# Patient Record
Sex: Male | Born: 1965 | Race: White | Hispanic: No | Marital: Single | Smoking: Never smoker
Health system: Southern US, Community
[De-identification: ages and names within clinical notes are randomized; demographics above are authoritative.]

## PROBLEM LIST (undated history)

## (undated) DIAGNOSIS — K219 Gastro-esophageal reflux disease without esophagitis: Secondary | ICD-10-CM

---

## 2011-06-26 ENCOUNTER — Other Ambulatory Visit: Payer: Self-pay | Admitting: Family Medicine

## 2011-06-26 DIAGNOSIS — N63 Unspecified lump in unspecified breast: Secondary | ICD-10-CM

## 2011-06-27 ENCOUNTER — Other Ambulatory Visit: Payer: Self-pay

## 2011-07-04 ENCOUNTER — Ambulatory Visit
Admission: RE | Admit: 2011-07-04 | Discharge: 2011-07-04 | Disposition: A | Discharge status: Home / Self Care | Payer: 59 | Source: Ambulatory Visit | Attending: Family Medicine | Admitting: Family Medicine

## 2011-07-04 DIAGNOSIS — N63 Unspecified lump in unspecified breast: Secondary | ICD-10-CM

## 2017-10-25 DIAGNOSIS — Z0000 Encounter for general adult medical examination without abnormal findings: Secondary | ICD-10-CM | POA: Diagnosis not present

## 2017-10-25 DIAGNOSIS — Z13220 Encounter for screening for lipoid disorders: Secondary | ICD-10-CM | POA: Diagnosis not present

## 2017-10-25 DIAGNOSIS — Z23 Encounter for immunization: Secondary | ICD-10-CM | POA: Diagnosis not present

## 2017-11-05 ENCOUNTER — Other Ambulatory Visit: Payer: Self-pay | Admitting: Family Medicine

## 2017-11-05 ENCOUNTER — Ambulatory Visit
Admission: RE | Admit: 2017-11-05 | Discharge: 2017-11-05 | Disposition: A | Discharge status: Home / Self Care | Payer: 59 | Source: Ambulatory Visit | Attending: Family Medicine | Admitting: Family Medicine

## 2017-11-05 DIAGNOSIS — R1310 Dysphagia, unspecified: Secondary | ICD-10-CM

## 2018-01-01 DIAGNOSIS — K222 Esophageal obstruction: Secondary | ICD-10-CM | POA: Diagnosis not present

## 2018-01-01 DIAGNOSIS — Z1211 Encounter for screening for malignant neoplasm of colon: Secondary | ICD-10-CM | POA: Diagnosis not present

## 2018-01-01 DIAGNOSIS — R1310 Dysphagia, unspecified: Secondary | ICD-10-CM | POA: Diagnosis not present

## 2018-02-08 DIAGNOSIS — Z1211 Encounter for screening for malignant neoplasm of colon: Secondary | ICD-10-CM | POA: Diagnosis not present

## 2018-02-08 DIAGNOSIS — K2930 Chronic superficial gastritis without bleeding: Secondary | ICD-10-CM | POA: Diagnosis not present

## 2018-02-08 DIAGNOSIS — K317 Polyp of stomach and duodenum: Secondary | ICD-10-CM | POA: Diagnosis not present

## 2018-02-08 DIAGNOSIS — R1310 Dysphagia, unspecified: Secondary | ICD-10-CM | POA: Diagnosis not present

## 2018-02-08 DIAGNOSIS — D126 Benign neoplasm of colon, unspecified: Secondary | ICD-10-CM | POA: Diagnosis not present

## 2018-02-08 DIAGNOSIS — K200 Eosinophilic esophagitis: Secondary | ICD-10-CM | POA: Diagnosis not present

## 2018-08-01 DIAGNOSIS — H35363 Drusen (degenerative) of macula, bilateral: Secondary | ICD-10-CM | POA: Diagnosis not present

## 2020-02-06 DIAGNOSIS — F439 Reaction to severe stress, unspecified: Secondary | ICD-10-CM | POA: Diagnosis not present

## 2020-02-06 DIAGNOSIS — R55 Syncope and collapse: Secondary | ICD-10-CM | POA: Diagnosis not present

## 2020-02-06 DIAGNOSIS — R202 Paresthesia of skin: Secondary | ICD-10-CM | POA: Diagnosis not present

## 2020-02-06 DIAGNOSIS — H538 Other visual disturbances: Secondary | ICD-10-CM | POA: Diagnosis not present

## 2020-02-24 ENCOUNTER — Encounter (HOSPITAL_COMMUNITY): Payer: Self-pay

## 2020-02-24 ENCOUNTER — Emergency Department (HOSPITAL_COMMUNITY): Payer: BC Managed Care – PPO

## 2020-02-24 ENCOUNTER — Other Ambulatory Visit: Payer: Self-pay

## 2020-02-24 ENCOUNTER — Emergency Department (HOSPITAL_COMMUNITY)
Admission: EM | Admit: 2020-02-24 | Discharge: 2020-02-24 | Disposition: A | Discharge status: Home / Self Care | Payer: BC Managed Care – PPO | Attending: Emergency Medicine | Admitting: Emergency Medicine

## 2020-02-24 DIAGNOSIS — H538 Other visual disturbances: Secondary | ICD-10-CM | POA: Diagnosis not present

## 2020-02-24 DIAGNOSIS — R202 Paresthesia of skin: Secondary | ICD-10-CM | POA: Diagnosis not present

## 2020-02-24 DIAGNOSIS — R519 Headache, unspecified: Secondary | ICD-10-CM | POA: Diagnosis not present

## 2020-02-24 DIAGNOSIS — I10 Essential (primary) hypertension: Secondary | ICD-10-CM | POA: Diagnosis not present

## 2020-02-24 HISTORY — DX: Gastro-esophageal reflux disease without esophagitis: K21.9

## 2020-02-24 LAB — BASIC METABOLIC PANEL
Anion gap: 8 (ref 5–15)
BUN: 20 mg/dL (ref 6–20)
CO2: 27 mmol/L (ref 22–32)
Calcium: 9.6 mg/dL (ref 8.9–10.3)
Chloride: 102 mmol/L (ref 98–111)
Creatinine, Ser: 1.22 mg/dL (ref 0.61–1.24)
GFR calc Af Amer: >60 mL/min (ref >60)
GFR calc non Af Amer: >60 mL/min (ref >60)
Glucose, Bld: 100 mg/dL — ABNORMAL HIGH (ref 70–99)
Potassium: 4.8 mmol/L (ref 3.5–5.1)
Sodium: 137 mmol/L (ref 135–145)

## 2020-02-24 LAB — CBC
HCT: 53.5 % — ABNORMAL HIGH (ref 39.0–52.0)
Hemoglobin: 18.4 g/dL — ABNORMAL HIGH (ref 13.0–17.0)
MCH: 30.2 pg (ref 26.0–34.0)
MCHC: 34.4 g/dL (ref 30.0–36.0)
MCV: 87.8 fL (ref 80.0–100.0)
Platelets: 207 10*3/uL (ref 150–400)
RBC: 6.09 MIL/uL — ABNORMAL HIGH (ref 4.22–5.81)
RDW: 12.1 % (ref 11.5–15.5)
WBC: 5.8 10*3/uL (ref 4.0–10.5)
nRBC: 0 % (ref 0.0–0.2)

## 2020-02-24 LAB — MAGNESIUM: Magnesium: 2.1 mg/dL (ref 1.7–2.4)

## 2020-02-24 LAB — TSH: TSH: 1.377 u[IU]/mL (ref 0.350–4.500)

## 2020-02-24 LAB — HEPATIC FUNCTION PANEL
ALT: 38 U/L (ref 0–44)
AST: 30 U/L (ref 15–41)
Albumin: 4.5 g/dL (ref 3.5–5.0)
Alkaline Phosphatase: 49 U/L (ref 38–126)
Bilirubin, Direct: 0.3 mg/dL — ABNORMAL HIGH (ref 0.0–0.2)
Indirect Bilirubin: 3.2 mg/dL — ABNORMAL HIGH (ref 0.3–0.9)
Total Bilirubin: 3.5 mg/dL — ABNORMAL HIGH (ref 0.3–1.2)
Total Protein: 7.5 g/dL (ref 6.5–8.1)

## 2020-02-24 LAB — SEDIMENTATION RATE: Sed Rate: 1 mm/hr (ref 0–16)

## 2020-02-24 MED ORDER — HYDROXYZINE HCL 25 MG PO TABS: 25.0000 mg | ORAL_TABLET | Freq: Four times a day (QID) | ORAL | 0 refills | Status: AC | PRN

## 2020-02-24 MED ORDER — KETOROLAC TROMETHAMINE 30 MG/ML IJ SOLN: 30.0000 mg | Freq: Once | INTRAMUSCULAR | Status: DC

## 2020-02-24 MED ORDER — LORAZEPAM 2 MG/ML IJ SOLN
INTRAMUSCULAR | Status: AC
  Administered 2020-02-24: 1 mg via INTRAVENOUS
  Filled 2020-02-24: qty 1

## 2020-02-24 MED ORDER — AMLODIPINE BESYLATE 5 MG PO TABS
5.0000 mg | ORAL_TABLET | Freq: Once | ORAL | Status: AC
  Administered 2020-02-24: 5 mg via ORAL
  Filled 2020-02-24: qty 1

## 2020-02-24 MED ORDER — SODIUM CHLORIDE 0.9% FLUSH: 3.0000 mL | Freq: Once | INTRAVENOUS | Status: DC

## 2020-02-24 MED ORDER — DIPHENHYDRAMINE HCL 50 MG/ML IJ SOLN: 25.0000 mg | Freq: Once | INTRAMUSCULAR | Status: DC

## 2020-02-24 MED ORDER — METOCLOPRAMIDE HCL 5 MG/ML IJ SOLN: 10.0000 mg | Freq: Once | INTRAMUSCULAR | Status: DC

## 2020-02-24 MED ORDER — LORAZEPAM 2 MG/ML IJ SOLN: 1.0000 mg | Freq: Once | INTRAMUSCULAR | Status: AC

## 2020-02-24 MED ORDER — AMLODIPINE BESYLATE 5 MG PO TABS
5.0000 mg | ORAL_TABLET | Freq: Every day | ORAL | 0 refills | Status: AC
Start: 2020-02-24 — End: ?

## 2020-02-24 NOTE — ED Provider Notes (Signed)
[facility] DEPT Provider Note   CSN: [id number] Arrival date & time: 02/24/20  0754     History Chief Complaint  Patient presents with  . Numbness  . Blurred Vision  . Nausea    Codee E Von Der Erlene Senters is a 54 y.o. male.  HPI Patient has had several recurrent episodes over the past month of tingling and numbness sensation along the left side of his face and sometimes involving the left arm.  These episodes have lasted anywhere from 15 minutes to an hour.  Sometimes there is associated pain around the ear in conjunction with feeling of numbness or tingling of the lateral face and left arm.  Patient is not experiencing severe or lancinating headaches.  Vision will be blurred in association of these headaches, patient notes that the blurring seems slightly worse in the left as opposed to the right but both eyes are involved.  No problems with gait dysfunction.  No problems with incoordination.  Patient reports that he did see his PCP and had an EKG done that was normal as well as some screening lab work.  He has not had any imaging done.  He does not experience nausea or vomiting in association with these episodes.  No fevers or chills.  Patient has not had sinus congestion sore throat or other signs of infectious symptoms.  No cough or chest congestion.  Patient reports that his blood pressure has been moderately elevated for at least a number of months.  He reports that the diastolic numbers consistently high and sometimes a systolic numbers up in the 160s.  He reports his PCP has not started him on any medications at this point.    Past Medical History:  Diagnosis Date  . GERD (gastroesophageal reflux disease)     There are no problems to display for this patient.   History reviewed. No pertinent surgical history.     Family History  Problem Relation Age of Onset  . Rheum arthritis Mother   . Polymyalgia rheumatica Mother   . Hypertension Mother     . Parkinson's disease Father   . Cancer Father   . Hypertension Father     Social History   Tobacco Use  . Smoking status: Never Smoker  . Smokeless tobacco: Never Used  Substance Use Topics  . Alcohol use: Never  . Drug use: Never    Home Medications Prior to Admission medications   Medication Sig Start Date End Date Taking? Authorizing Provider  ibuprofen (ADVIL) 200 MG tablet Take 400 mg by mouth every 6 (six) hours as needed for headache or mild pain.   Yes [provider]  pantoprazole (PROTONIX) 40 MG tablet Take 40 mg by mouth daily. 01/09/20  Yes [provider]  amLODipine (NORVASC) 5 MG tablet Take 1 tablet (5 mg total) by mouth daily. 02/24/20   Charlesetta Shanks, MD  hydrOXYzine (ATARAX/VISTARIL) 25 MG tablet Take 1 tablet (25 mg total) by mouth every 6 (six) hours as needed for anxiety. 02/24/20   Charlesetta Shanks, MD    Allergies    Prednisone  Review of Systems   Review of Systems 10 systems reviewed and negative except as per HPI. Physical Exam Updated Vital Signs BP (!) 143/103   Pulse (!) 58   Temp 97.8 F (36.6 C) (Oral)   Resp 18   Ht 6' (1.829 m)   SpO2 100%   Physical Exam Constitutional:      Appearance: Normal appearance. He is  well-developed.  HENT:     Head: Normocephalic and atraumatic.     Right Ear: Tympanic membrane normal.     Left Ear: Tympanic membrane normal.     Nose: Nose normal.     Mouth/Throat:     Mouth: Mucous membranes are moist.     Pharynx: Oropharynx is clear.  Eyes:     Extraocular Movements: Extraocular movements intact.     Conjunctiva/sclera: Conjunctivae normal.     Pupils: Pupils are equal, round, and reactive to light.  Cardiovascular:     Rate and Rhythm: Normal rate and regular rhythm.     Heart sounds: Normal heart sounds.  Pulmonary:     Effort: Pulmonary effort is normal.     Breath sounds: Normal breath sounds.  Abdominal:     General: Bowel sounds are normal. There is no distension.      Palpations: Abdomen is soft.     Tenderness: There is no abdominal tenderness.  Musculoskeletal:        General: Normal range of motion.     Cervical back: Neck supple.  Skin:    General: Skin is warm and dry.  Neurological:     Mental Status: He is alert and oriented to person, place, and time.     GCS: GCS eye subscore is 4. GCS verbal subscore is 5. GCS motor subscore is 6.     Coordination: Coordination normal.     Comments: Cognitive function normal.  Speech clear.  No cranial nerve deficits.  Normal finger-nose exam.  Normal consensual pupillary responses.  Normal heel toe walk.  Motor strength 5\5 upper and lower extremities.  No differential to light touch right to left on face upper extremity or lower extremity.  Psychiatric:        Mood and Affect: Mood normal.     ED Results / Procedures / Treatments   Labs (all labs ordered are listed, but only abnormal results are displayed) Labs Reviewed  BASIC METABOLIC PANEL - Abnormal; Notable for the following components:      Result Value   Glucose, Bld 100 (*)    All other components within normal limits  CBC - Abnormal; Notable for the following components:   RBC 6.09 (*)    Hemoglobin 18.4 (*)    HCT 53.5 (*)    All other components within normal limits  HEPATIC FUNCTION PANEL - Abnormal; Notable for the following components:   Total Bilirubin 3.5 (*)    Bilirubin, Direct 0.3 (*)    Indirect Bilirubin 3.2 (*)    All other components within normal limits  TSH  SEDIMENTATION RATE  MAGNESIUM  URINALYSIS, ROUTINE W REFLEX MICROSCOPIC    EKG EKG Interpretation  Date/Time:  Tuesday Feb 24 2020 08:14:48 EDT Ventricular Rate:  71 PR Interval:    QRS Duration: 98 QT Interval:  373 QTC Calculation: 406 R Axis:   43 Text Interpretation: Sinus rhythm RSR' in V1 or V2, probably normal variant normal, no old comparison Confirmed by Charlesetta Shanks [phone]) on 02/24/2020 12:52:21 PM   Radiology MR Brain Wo Contrast  (neuro protocol)  Result Date: 02/24/2020 CLINICAL DATA:  Tingling of the left face and arm. Some blurred vision and nausea. EXAM: MRI HEAD WITHOUT CONTRAST TECHNIQUE: Multiplanar, multiecho pulse sequences of the brain and surrounding structures were obtained without intravenous contrast. COMPARISON:  None. FINDINGS: Brain: The brain has a normal appearance without evidence of malformation, atrophy, old or acute small or large vessel infarction, mass lesion, hemorrhage,  hydrocephalus or extra-axial collection. Vascular: Major vessels at the base of the brain show flow. Venous sinuses appear patent. Skull and upper cervical spine: Normal. Sinuses/Orbits: Mucosal thickening and opacification of the right division of the sphenoid sinus. Other paranasal sinuses are clear. Orbits negative. Other: None significant. IMPRESSION: Normal appearance of the brain itself. No evidence of acute or old ischemic change or demyelinating disease. Mucosal inflammatory changes of the right division of the sphenoid sinus. Electronically Signed   By: Nelson Chimes M.D.   On: 02/24/2020 12:10    Procedures Procedures (including critical care time)  Medications Ordered in ED Medications  amLODipine (NORVASC) tablet 5 mg (has no administration in time range)  LORazepam (ATIVAN) injection 1 mg (1 mg Intravenous Given 02/24/20 1127)    ED Course  I have reviewed the triage vital signs and the nursing notes.  Pertinent labs & imaging results that were available during my care of the patient were reviewed by me and considered in my medical decision making (see chart for details).    MDM Rules/Calculators/A&P                       Patient presents aligned above.  He is experiencing more frequent episodes of paresthesias and blurred vision over the past 24 hours.  Patient neurologic exam is normal.  No signs of infectious etiology.  MRI obtained to rule out stroke or MS.  No acute findings identified.  Patient has diastolic  hypertension.  He describes this is consistent now for at least several months.  I do not suspect that blood pressures are responsible for the patient's symptoms.  However with consistent diastolics [id number] over a number of months will start empiric dose of amlodipine 5 mg with recommendation for close follow-up with PCP.  Ambulatory referral made for [facility] for further evaluation of patient's symptoms.  We discussed possibility of migraine variant type symptoms with some degree of pain in association with paresthesias.  Return precautions reviewed. Patient presents aligned above.  He experienced more frequent episodes of final Clinical Impression(s) / ED Diagnoses Final diagnoses:  Paresthesia  Nonintractable episodic headache, unspecified headache type  Blurred vision    Rx / DC Orders ED Discharge Orders         Ordered    amLODipine (NORVASC) 5 MG tablet  Daily     02/24/20 1407    hydrOXYzine (ATARAX/VISTARIL) 25 MG tablet  Every 6 hours PRN     02/24/20 1407    Ambulatory referral to Neurology    Comments: An appointment is requested in approximately: 1 week   02/24/20 1409           Charlesetta Shanks, MD 02/24/20 1434

## 2020-02-24 NOTE — ED Triage Notes (Signed)
Patient states he had tingling of the left face and down the down arm. Patient states he also had blurred vision and nausea. Patient states he has had this episode approx 5 to 6 times since 2019. Patient states he "just does not feel right."  Patient added that when he lies down he feels like his heart is racing.

## 2020-02-26 ENCOUNTER — Ambulatory Visit (INDEPENDENT_AMBULATORY_CARE_PROVIDER_SITE_OTHER): Payer: BC Managed Care – PPO | Admitting: Neurology

## 2020-02-26 ENCOUNTER — Encounter: Payer: Self-pay | Admitting: Neurology

## 2020-02-26 ENCOUNTER — Telehealth: Payer: Self-pay | Admitting: Neurology

## 2020-02-26 ENCOUNTER — Other Ambulatory Visit: Payer: Self-pay

## 2020-02-26 VITALS — BP 138/89 | HR 69 | Ht 72.0 in | Wt 208.0 lb

## 2020-02-26 DIAGNOSIS — R202 Paresthesia of skin: Secondary | ICD-10-CM | POA: Diagnosis not present

## 2020-02-26 DIAGNOSIS — R519 Headache, unspecified: Secondary | ICD-10-CM

## 2020-02-26 DIAGNOSIS — G43109 Migraine with aura, not intractable, without status migrainosus: Secondary | ICD-10-CM

## 2020-02-26 DIAGNOSIS — I10 Essential (primary) hypertension: Secondary | ICD-10-CM | POA: Diagnosis not present

## 2020-02-26 MED ORDER — NURTEC 75 MG PO TBDP: 75.0000 mg | ORAL_TABLET | ORAL | 2 refills | Status: DC | PRN

## 2020-02-26 MED ORDER — NURTEC 75 MG PO TBDP: 75.0000 mg | ORAL_TABLET | ORAL | 2 refills | Status: AC | PRN

## 2020-02-26 NOTE — Progress Notes (Signed)
Subjective:    Patient ID: Matthew Hanna is a 54 y.o. male.  HPI     Star Age, MD, PhD [facility] [address], [address] [place], [place] [address]  I saw patient, Matthew Hanna, as a referral from the Emergency room for left facial paresthesias and left upper extremity paresthesias intermittently.  The patient is unaccompanied today.  He He is a 54 year old right-handed gentleman with an underlying medical history of reflux disease, elevated blood pressure values, and intermittent anxiety, who presented to the emergency room on 02/24/2020 with a recent onset the day before of left facial pain and tingling starting around the anterior aspect of the left ear and affecting the lower face, he also had some tingling in the left upper extremity.  He did not have any weakness, he did have a slight headache and he did have associated nausea.  He has had some blurry vision alongside with this.  He does not have a prior diagnosis of migraines but intermittently since approximately 2019 he has had sporadic symptoms similar to this sometimes without any headache and sometimes with a mild headache, often with blurry vision or even a visual blotchiness reported and typically no vomiting but often nausea.  Sometimes these symptoms are associated with feeling of restlessness and anxiety, uneasiness and it helps to lie down.  He has a history of elevated blood pressure values but has never formally been diagnosed with hypertension.  He sees his primary care physician infrequently.  He denies any significant snoring, night to night nocturia or waking up with a headache. He reports no family history of migraines.  He has never had any one-sided weakness, slurring of speech.  He has a fairly strong family history of hypertension.  He has a twin brother and an older brother.  He does not always get enough sleep he admits.  He also has recently taken up a new job since  last year and reports increase in job-related stress.  He does not drink caffeine on a regular basis.  He has never had migraines growing up.  He tries to hydrate well.  He lives with husband and 2 young children, ages 35 and 8.  Patient has been working from home.He currently feels at baseline.  In the emergency room he was given a prescription for amlodipine as his blood pressure was elevated and hydroxyzine for as needed use for anxiety. He has prescription reading eyeglasses and has not had an eye examination in nearly 2 years. He had a brain MRI without contrast on 02/24/2020 and I reviewed the results: IMPRESSION: Normal appearance of the brain itself. No evidence of acute or old ischemic change or demyelinating disease.  Mucosal inflammatory changes of the right division of the sphenoid Sinus. I also personally reviewed the images.  His Past Medical History Is Significant For: Past Medical History:  Diagnosis Date  . GERD (gastroesophageal reflux disease)     His Past Surgical History Is Significant For: History reviewed. No pertinent surgical history.  His Family History Is Significant For: Family History  Problem Relation Age of Onset  . Rheum arthritis Mother   . Polymyalgia rheumatica Mother   . Hypertension Mother   . Parkinson's disease Father   . Cancer Father   . Hypertension Father     His Social History Is Significant For: Social History   Socioeconomic History  . Marital status: Single    Spouse name: Not on file  .  Number of children: Not on file  . Years of education: Not on file  . Highest education level: Not on file  Occupational History  . Not on file  Tobacco Use  . Smoking status: Never Smoker  . Smokeless tobacco: Never Used  Substance and Sexual Activity  . Alcohol use: Never  . Drug use: Never  . Sexual activity: Not on file  Other Topics Concern  . Not on file  Social History Narrative  . Not on file   Social Determinants of Health    Financial Resource Strain:   . Difficulty of Paying Living Expenses:   Food Insecurity:   . Worried About Charity fundraiser in the Last Year:   . Arboriculturist in the Last Year:   Transportation Needs:   . Film/video editor (Medical):   Marland Kitchen Lack of Transportation (Non-Medical):   Physical Activity:   . Days of Exercise per Week:   . Minutes of Exercise per Session:   Stress:   . Feeling of Stress :   Social Connections:   . Frequency of Communication with Friends and Family:   . Frequency of Social Gatherings with Friends and Family:   . Attends Religious Services:   . Active Member of Clubs or Organizations:   . Attends Archivist Meetings:   Marland Kitchen Marital Status:     His Allergies Are:  Allergies  Allergen Reactions  . Prednisone Rash  :   His Current Medications Are:  Outpatient Encounter Medications as of 02/26/2020  Medication Sig  . amLODipine (NORVASC) 5 MG tablet Take 1 tablet (5 mg total) by mouth daily.  . hydrOXYzine (ATARAX/VISTARIL) 25 MG tablet Take 1 tablet (25 mg total) by mouth every 6 (six) hours as needed for anxiety.  Marland Kitchen ibuprofen (ADVIL) 200 MG tablet Take 400 mg by mouth every 6 (six) hours as needed for headache or mild pain.  . pantoprazole (PROTONIX) 40 MG tablet Take 40 mg by mouth daily.  . Rimegepant Sulfate (NURTEC) 75 MG TBDP Take 75 mg by mouth as needed (may repeat in 2 hours, no more than 2 pills in 24 hours).  . [DISCONTINUED] Rimegepant Sulfate (NURTEC) 75 MG TBDP Take 75 mg by mouth as needed (may repeat in 2 hours, no more than 2 pills in 24 hours).   No facility-administered encounter medications on file as of 02/26/2020.  : Review of Systems:  Out of a complete 14 point review of systems, all are reviewed and negative with the exception of these symptoms as listed below:  Review of Systems  Neurological:       Reports (5-6) events since 2019 of blurred vision, tingling in the face and arm (mostly on the left side), HR  increase and nausea.  Most recent event was 2 nights ago. Pt when to the ED and he was started amlodipine and hydroxyzine.      Objective:  Neurological Exam  Physical Exam Physical Examination:   Vitals:   02/26/20 0857  BP: 138/89  Pulse: 69    General Examination: The patient is a very pleasant 54 y.o. male in no acute distress. He appears well-developed and well-nourished and well groomed.   HEENT: Normocephalic, atraumatic, pupils are equal, round and reactive to light and accommodation. Funduscopic exam is normal with sharp disc margins noted. Extraocular tracking is good without limitation to gaze excursion or nystagmus noted. Normal smooth pursuit is noted. Hearing is grossly intact. Tympanic membranes are clear bilaterally. Face  is symmetric with normal facial animation and normal facial sensation. Speech is clear with no dysarthria noted. There is no hypophonia. There is no lip, neck/head, jaw or voice tremor. Neck is supple with full range of passive and active motion. There are no carotid bruits on auscultation. Oropharynx exam reveals: mild mouth dryness, good dental hygiene and mild airway crowding. Tongue protrudes centrally and palate elevates symmetrically.   Chest: Clear to auscultation without wheezing, rhonchi or crackles noted.  Heart: S1+S2+0, regular and normal without murmurs, rubs or gallops noted.   Abdomen: Soft, non-tender and non-distended with normal bowel sounds appreciated on auscultation.  Extremities: There is no pitting edema in the distal lower extremities bilaterally. Pedal pulses are intact.  Skin: Warm and dry without trophic changes noted.  Musculoskeletal: exam reveals no obvious joint deformities, tenderness or joint swelling or erythema.   Neurologically:  Mental status: The patient is awake, alert and oriented in all 4 spheres. His immediate and remote memory, attention, language skills and fund of knowledge are appropriate. There is no  evidence of aphasia, agnosia, apraxia or anomia. Speech is clear with normal prosody and enunciation. Thought process is linear. Mood is normal and affect is normal.  Cranial nerves II - XII are as described above under HEENT exam. In addition: shoulder shrug is normal with equal shoulder height noted. Motor exam: Normal bulk, strength and tone is noted. There is no drift, tremor or rebound. Romberg is negative. Reflexes are 2+ throughout. Babinski: Toes are flexor bilaterally. Fine motor skills and coordination: intact with normal finger taps, normal hand movements, normal rapid alternating patting, normal foot taps and normal foot agility.  Cerebellar testing: No dysmetria or intention tremor on finger to nose testing. Heel to shin is unremarkable bilaterally. There is no truncal or gait ataxia.  Sensory exam: intact to light touch, pinprick, vibration, temperature sense in the upper and lower extremities.  Gait, station and balance: He stands easily. No veering to one side is noted. No leaning to one side is noted. Posture is age-appropriate and stance is narrow based. Gait shows normal stride length and normal pace. No problems turning are noted. Tandem walk is unremarkable.   Assessment and Plan:   In summary, Jemarion E Von Der Erlene Hanna is a very pleasant 54 y.o.-year old male with an underlying medical history of reflux disease, elevated blood pressure values, and intermittent anxiety, who presents for evaluation of his left-sided paresthesias associated with nausea, and visual symptoms.  He has had infrequent symptoms over the past year or 2.  He feels at baseline currently.  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  We talked about triggers including stress, dehydration, sleep deprivation, certain food triggers, caffeine consumption.  He has a nonfocal neurological exam which is reassuring, also had a  recent noncontrast brain MRI which was normal.  Given that he has not had an eye examination in nearly 2 years I would like for him to get a formal eye examination done.  In addition, to have a complete evaluation of his brain with a scan I would like to proceed with a brain MRI with and without contrast.  In addition, given the episodic and stereotypical nature of his symptoms and no prior history of migraines growing up I would like to do an EEG in our office.  We talked about pursuing a healthy lifestyle, staying well-hydrated and well rested, underlying sleep  disordered breathing is not currently a concern but he is advised to talk to his husband about any observations from his standpoint regarding patient's sleep such as snoring or irregularities in his breathing.  The patient is advised to make enough time for sleep and stay well-hydrated.  I suggested as needed use of Nurtec 75 mg strength.  We went over expectations, and side effect profile.  He was given written instructions and a new prescription.  We will keep him posted as to his test results by phone call and plan to see him back in about 3 months, sooner if needed.  I answered all his questions today and he was in agreement.    Star Age, MD, PhD

## 2020-02-26 NOTE — Patient Instructions (Signed)
As discussed, I suspect you have migraine auras, you do not always have to have a headache with migraines with aura.  For as needed use, we will try you on Nurtec 75 mg strength: Take 1 pill at onset of migraine headache, may repeat in 2 hours, no more than 2 pills in 24 hours. May cause sedation and nausea.   I would like for you to try the orally disintegrating Nurtec.  I have made a special note for your pharmacist to look for it.  It does not show up in our prescription system as available.   You may be eligible to use a co-pay card for the Nurtec.  I would like to do an EEG which is a brainwave test, we will do this in our office.  For completion, I would also like for you to have another MRI of the brain, to be done with contrast.  Please make sure you have an updated eye examination.  Your neurological exam is normal which is of course reassuring.  Please follow-up routinely in 3 months.  We may consider a sleep study in the near future if needed.

## 2020-02-26 NOTE — Telephone Encounter (Signed)
Rep with pharmacy called to inform they did not receive prescription for the pts Rimegepant Sulfate (NURTEC) 75 MG TBDP

## 2020-02-26 NOTE — Telephone Encounter (Signed)
I have re submitted the rx to [facility].

## 2020-03-01 DIAGNOSIS — H5310 Unspecified subjective visual disturbances: Secondary | ICD-10-CM | POA: Diagnosis not present

## 2020-03-01 DIAGNOSIS — H52203 Unspecified astigmatism, bilateral: Secondary | ICD-10-CM | POA: Diagnosis not present

## 2020-03-11 ENCOUNTER — Telehealth: Payer: Self-pay | Admitting: Neurology

## 2020-03-11 NOTE — Telephone Encounter (Signed)
Left voicemail for patient to call back. 

## 2020-03-11 NOTE — Telephone Encounter (Signed)
LVM for pt to call back about scheduling mri  [employer] Auth: [id number] (exp. 03/11/20 to 03/10/21)

## 2020-03-11 NOTE — Telephone Encounter (Signed)
Pt has returned the call to Emily, please call 

## 2020-03-29 ENCOUNTER — Other Ambulatory Visit: Payer: BC Managed Care – PPO

## 2020-05-17 ENCOUNTER — Other Ambulatory Visit (HOSPITAL_COMMUNITY): Payer: Self-pay | Admitting: Family Medicine

## 2020-05-31 ENCOUNTER — Ambulatory Visit: Payer: BC Managed Care – PPO | Admitting: Neurology

## 2020-06-02 DIAGNOSIS — K429 Umbilical hernia without obstruction or gangrene: Secondary | ICD-10-CM | POA: Diagnosis not present

## 2020-06-28 DIAGNOSIS — I10 Essential (primary) hypertension: Secondary | ICD-10-CM | POA: Diagnosis not present

## 2020-06-28 DIAGNOSIS — Z0001 Encounter for general adult medical examination with abnormal findings: Secondary | ICD-10-CM | POA: Diagnosis not present

## 2020-06-28 DIAGNOSIS — E7800 Pure hypercholesterolemia, unspecified: Secondary | ICD-10-CM | POA: Diagnosis not present

## 2020-06-28 DIAGNOSIS — Z125 Encounter for screening for malignant neoplasm of prostate: Secondary | ICD-10-CM | POA: Diagnosis not present

## 2020-10-12 ENCOUNTER — Other Ambulatory Visit (HOSPITAL_COMMUNITY): Payer: Self-pay | Admitting: Family Medicine

## 2020-10-21 DIAGNOSIS — R300 Dysuria: Secondary | ICD-10-CM | POA: Diagnosis not present

## 2020-11-02 ENCOUNTER — Other Ambulatory Visit: Payer: BC Managed Care – PPO

## 2020-12-29 ENCOUNTER — Other Ambulatory Visit (HOSPITAL_COMMUNITY): Payer: Self-pay | Admitting: Family Medicine

## 2021-01-13 ENCOUNTER — Other Ambulatory Visit (HOSPITAL_COMMUNITY): Payer: Self-pay

## 2021-01-13 MED FILL — Pantoprazole Sodium EC Tab 40 MG (Base Equiv): ORAL | Qty: 90 | Fill #0 | Status: CN

## 2021-01-13 MED FILL — Pantoprazole Sodium EC Tab 40 MG (Base Equiv): ORAL | 90 days supply | Qty: 90 | Fill #0 | Status: AC

## 2021-01-28 ENCOUNTER — Other Ambulatory Visit (HOSPITAL_COMMUNITY): Payer: Self-pay

## 2021-01-28 MED FILL — Amlodipine Besylate Tab 5 MG (Base Equivalent): ORAL | 30 days supply | Qty: 30 | Fill #0 | Status: AC

## 2021-02-28 ENCOUNTER — Other Ambulatory Visit (HOSPITAL_COMMUNITY): Payer: Self-pay

## 2021-02-28 MED FILL — Amlodipine Besylate Tab 5 MG (Base Equivalent): ORAL | 30 days supply | Qty: 30 | Fill #1 | Status: AC

## 2021-03-31 ENCOUNTER — Other Ambulatory Visit (HOSPITAL_COMMUNITY): Payer: Self-pay

## 2021-03-31 MED FILL — Amlodipine Besylate Tab 5 MG (Base Equivalent): ORAL | 30 days supply | Qty: 30 | Fill #2 | Status: AC

## 2021-04-15 ENCOUNTER — Other Ambulatory Visit (HOSPITAL_COMMUNITY): Payer: Self-pay

## 2021-04-15 MED FILL — Pantoprazole Sodium EC Tab 40 MG (Base Equiv): ORAL | 90 days supply | Qty: 90 | Fill #1 | Status: AC

## 2021-04-29 ENCOUNTER — Other Ambulatory Visit (HOSPITAL_COMMUNITY): Payer: Self-pay

## 2021-04-29 MED FILL — Amlodipine Besylate Tab 5 MG (Base Equivalent): ORAL | 30 days supply | Qty: 30 | Fill #3 | Status: CN

## 2021-04-29 MED FILL — Amlodipine Besylate Tab 5 MG (Base Equivalent): ORAL | 30 days supply | Qty: 30 | Fill #0 | Status: CN

## 2021-04-30 ENCOUNTER — Other Ambulatory Visit (HOSPITAL_COMMUNITY): Payer: Self-pay

## 2021-04-30 MED FILL — Amlodipine Besylate Tab 5 MG (Base Equivalent): ORAL | 90 days supply | Qty: 90 | Fill #0 | Status: CN

## 2021-04-30 MED FILL — Amlodipine Besylate Tab 5 MG (Base Equivalent): ORAL | 30 days supply | Qty: 30 | Fill #0 | Status: AC

## 2021-05-02 ENCOUNTER — Other Ambulatory Visit (HOSPITAL_COMMUNITY): Payer: Self-pay

## 2021-05-30 ENCOUNTER — Other Ambulatory Visit (HOSPITAL_COMMUNITY): Payer: Self-pay

## 2021-05-30 MED FILL — Amlodipine Besylate Tab 5 MG (Base Equivalent): ORAL | 30 days supply | Qty: 30 | Fill #0 | Status: AC

## 2021-05-30 MED FILL — Amlodipine Besylate Tab 5 MG (Base Equivalent): ORAL | 30 days supply | Qty: 30 | Fill #1 | Status: CN

## 2021-06-29 ENCOUNTER — Other Ambulatory Visit (HOSPITAL_COMMUNITY): Payer: Self-pay

## 2021-06-29 MED FILL — Amlodipine Besylate Tab 5 MG (Base Equivalent): ORAL | 30 days supply | Qty: 30 | Fill #1 | Status: AC

## 2021-07-15 ENCOUNTER — Other Ambulatory Visit (HOSPITAL_COMMUNITY): Payer: Self-pay

## 2021-07-15 MED FILL — Pantoprazole Sodium EC Tab 40 MG (Base Equiv): ORAL | 90 days supply | Qty: 90 | Fill #2 | Status: CN

## 2021-07-15 MED FILL — Pantoprazole Sodium EC Tab 40 MG (Base Equiv): ORAL | 90 days supply | Qty: 90 | Fill #0 | Status: AC

## 2021-07-28 ENCOUNTER — Other Ambulatory Visit (HOSPITAL_COMMUNITY): Payer: Self-pay

## 2021-07-28 MED ORDER — AMLODIPINE BESYLATE 5 MG PO TABS
ORAL_TABLET | ORAL | 7 refills | Status: DC
  Filled 2021-07-28: qty 30, 30d supply, fill #0
  Filled 2021-08-31: qty 30, 30d supply, fill #1
  Filled 2021-09-29: qty 30, 30d supply, fill #2
  Filled 2021-10-29: qty 30, 30d supply, fill #3
  Filled 2021-11-29: qty 30, 30d supply, fill #4
  Filled 2021-12-30: qty 30, 30d supply, fill #5
  Filled 2022-01-30: qty 30, 30d supply, fill #6
  Filled 2022-03-01: qty 30, 30d supply, fill #7

## 2021-08-31 ENCOUNTER — Other Ambulatory Visit (HOSPITAL_COMMUNITY): Payer: Self-pay

## 2021-09-29 ENCOUNTER — Other Ambulatory Visit (HOSPITAL_COMMUNITY): Payer: Self-pay

## 2021-10-14 ENCOUNTER — Other Ambulatory Visit (HOSPITAL_COMMUNITY): Payer: Self-pay

## 2021-10-14 MED ORDER — PANTOPRAZOLE SODIUM 40 MG PO TBEC
DELAYED_RELEASE_TABLET | ORAL | 2 refills | Status: DC
  Filled 2021-10-14: qty 90, 90d supply, fill #0
  Filled 2022-01-12: qty 90, 90d supply, fill #1
  Filled 2022-04-13: qty 90, 90d supply, fill #2

## 2021-10-29 ENCOUNTER — Other Ambulatory Visit (HOSPITAL_COMMUNITY): Payer: Self-pay

## 2021-11-28 IMAGING — MR MR HEAD W/O CM
10 of 11 series · 37 of 48 positions shown · non-contrast
Comparison: None.

CLINICAL DATA: Tingling of the left face and arm. Some blurred
vision and nausea.

EXAM:
MRI HEAD WITHOUT CONTRAST
TECHNIQUE: Multiplanar, multiecho pulse sequences of the brain and surrounding
structures were obtained without intravenous contrast.

[Series 5: dwi_tracew · axial · 3.0mm · 1.08mm/px · z∈[-23,+126]mm · 9 of 102 slices shown]
[im 1/102]
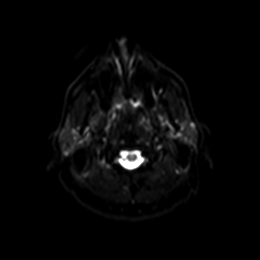
[im 13/102]
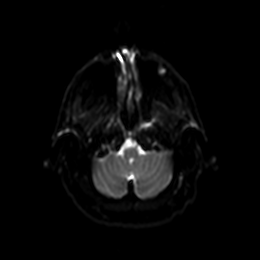
[im 26/102]
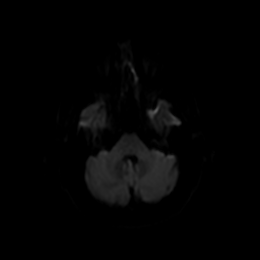
[im 38/102]
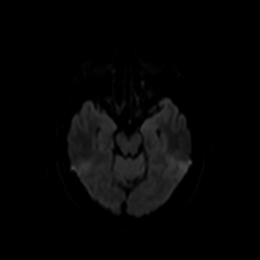
[im 51/102]
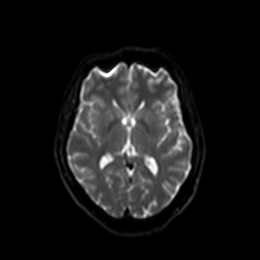
[im 64/102]
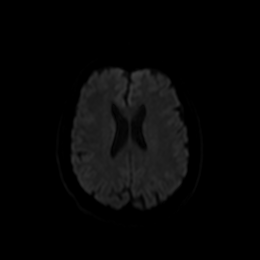
[im 76/102]
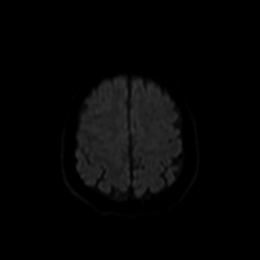
[im 89/102]
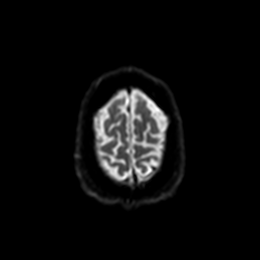
[im 102/102]
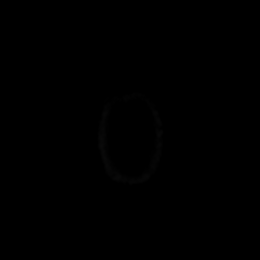

[Series 6: dwi_adc · axial · 3.0mm · 1.08mm/px · z∈[-23,+126]mm · 4 of 51 slices shown]
[im 1/51]
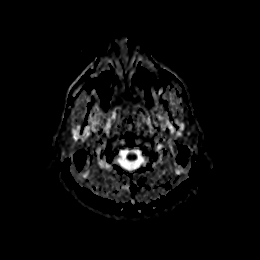
[im 17/51]
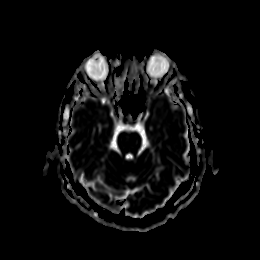
[im 34/51]
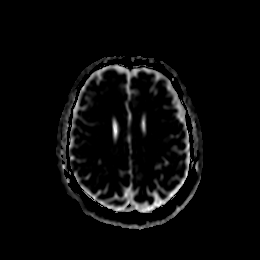
[im 51/51]
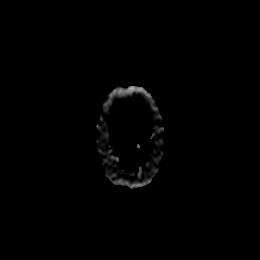

[Series 7: T2 · sagittal · 5.0mm · 0.47mm/px · 2 of 24 slices shown (1 of 3)]
[im 1/24]
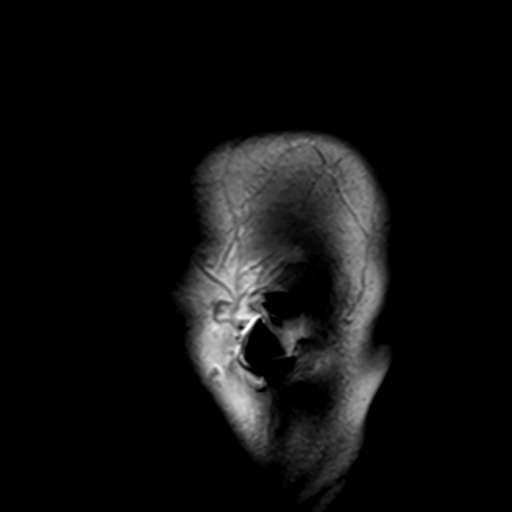
[im 24/24]
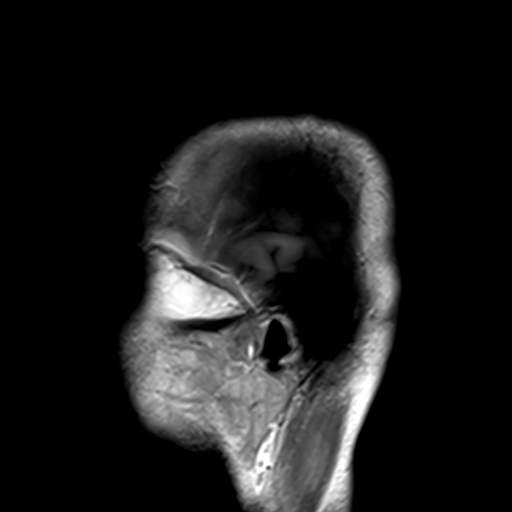

[Series 8: T2 · axial · 5.0mm · 0.45mm/px · z∈[-28,+120]mm · 2 of 24 slices shown (2 of 3)]
[im 1/24]
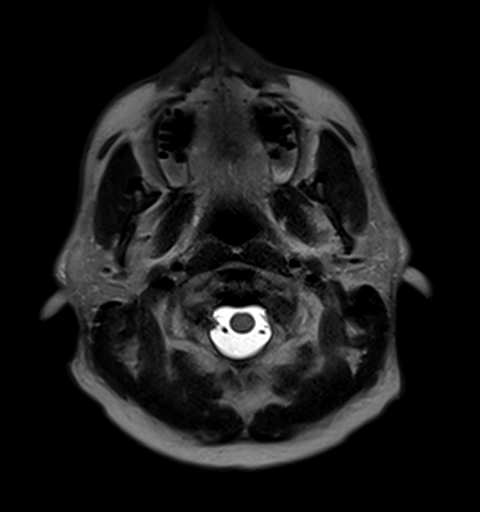
[im 24/24]
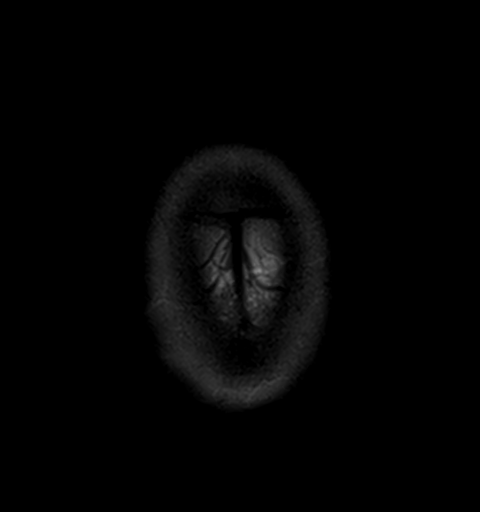

[Series 9: GRE · axial · 3.0mm · 0.45mm/px · z∈[-27,+122]mm · 4 of 51 slices shown]
[im 1/51]
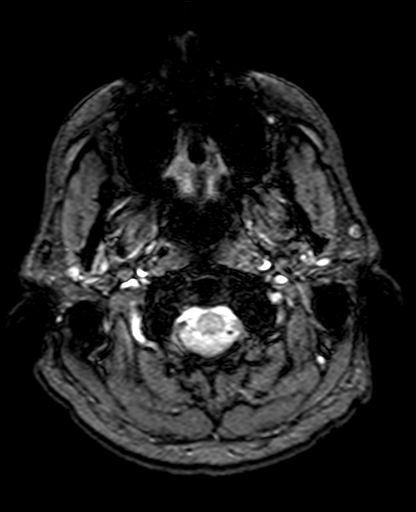
[im 17/51]
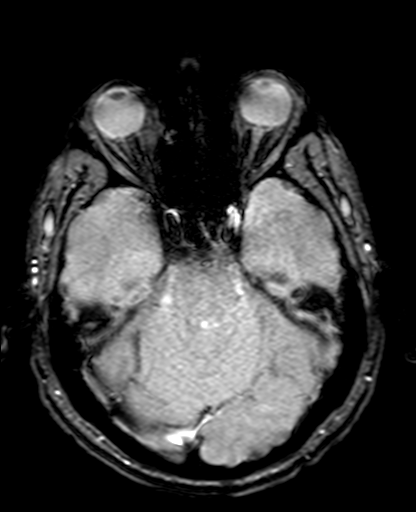
[im 34/51]
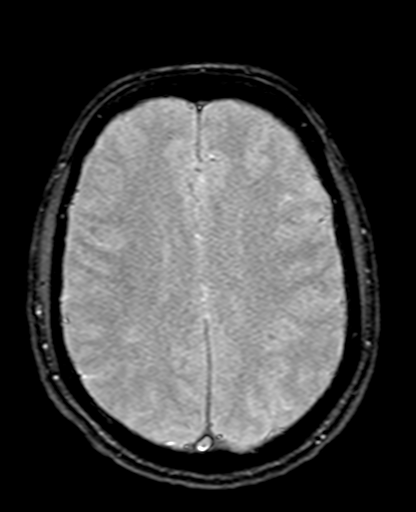
[im 51/51]
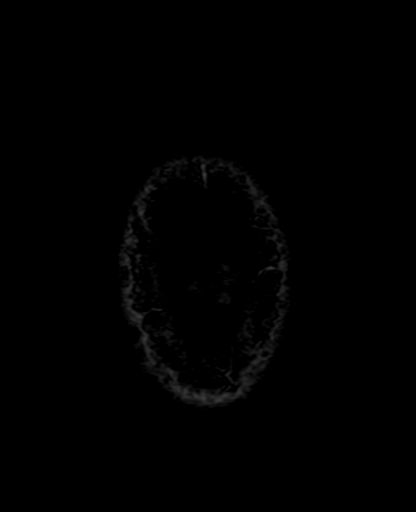

[Series 10: FLAIR · axial · 3.0mm · 0.86mm/px · z∈[-30,+119]mm · 4 of 51 slices shown]
[im 1/51]
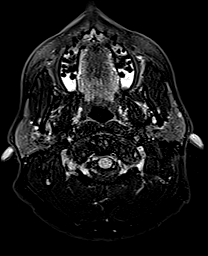
[im 17/51]
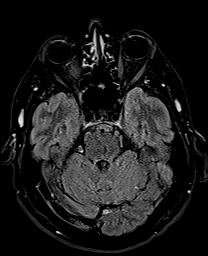
[im 34/51]
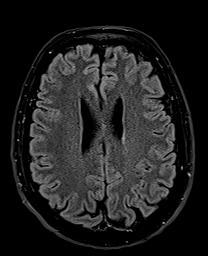
[im 51/51]
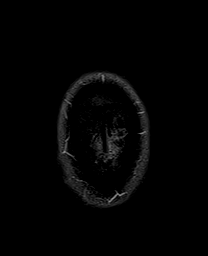

[Series 11: T1 · axial · 3.0mm · 0.45mm/px · z∈[-27,+122]mm · 4 of 51 slices shown]
[im 1/51]
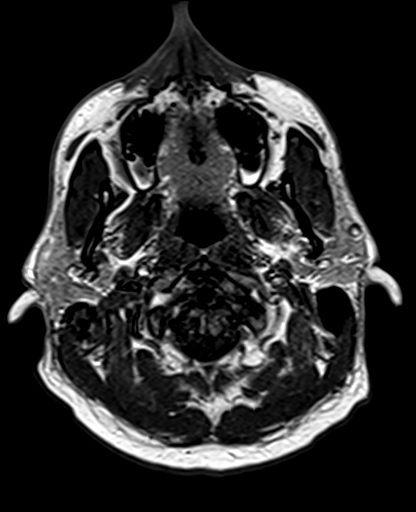
[im 17/51]
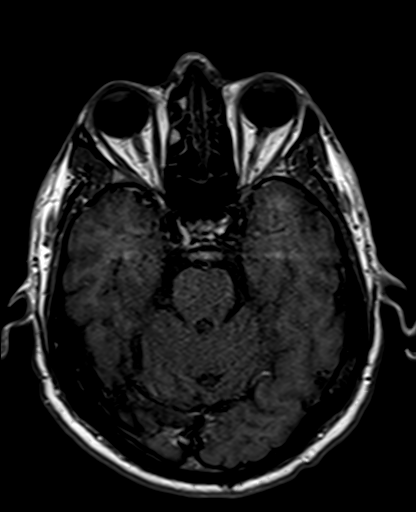
[im 34/51]
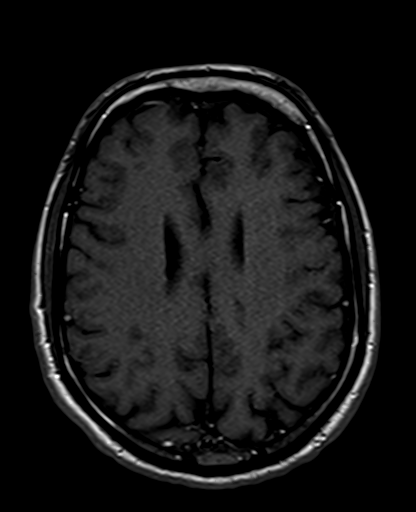
[im 51/51]
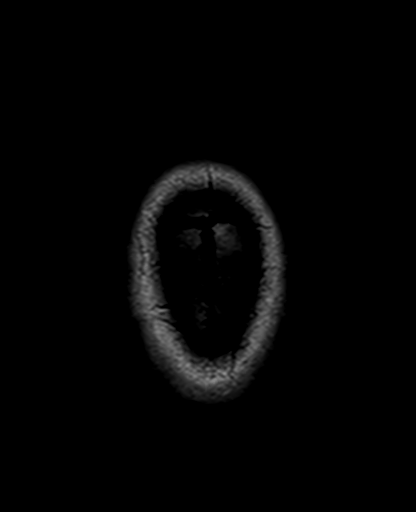

[Series 12: DWI · coronal · 5.0mm · 1.31mm/px · 4 of 52 slices shown (1 of 2)]
[im 1/52]
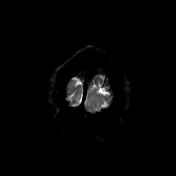
[im 18/52]
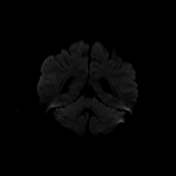
[im 35/52]
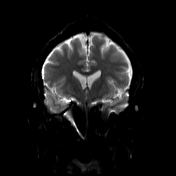
[im 52/52]
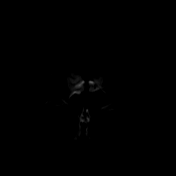

[Series 13: DWI · coronal · 5.0mm · 1.31mm/px · 2 of 26 slices shown (2 of 2)]
[im 1/26]
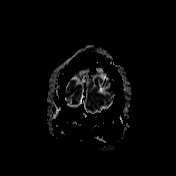
[im 26/26]
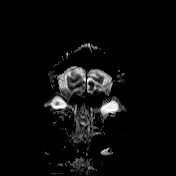

[Series 14: T2 · coronal · 5.0mm · 0.86mm/px · 2 of 26 slices shown (3 of 3)]
[im 1/26]
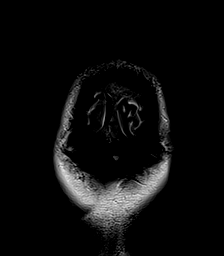
[im 26/26]
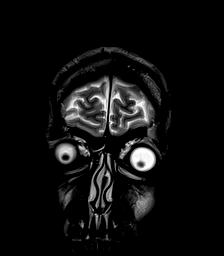

[37 of 48 positions shown; findings below may reference images not displayed]

FINDINGS: Brain: The brain has a normal appearance without evidence of
malformation, atrophy, old or acute small or large vessel
infarction, mass lesion, hemorrhage, hydrocephalus or extra-axial
collection.

Vascular: Major vessels at the base of the brain show flow. Venous
sinuses appear patent.

Skull and upper cervical spine: Normal.

Sinuses/Orbits: Mucosal thickening and opacification of the right
division of the sphenoid sinus. Other paranasal sinuses are clear.
Orbits negative.

Other: None significant.
IMPRESSION: Normal appearance of the brain itself. No evidence of acute or old
ischemic change or demyelinating disease.

Mucosal inflammatory changes of the right division of the sphenoid
sinus.

## 2021-11-29 ENCOUNTER — Other Ambulatory Visit (HOSPITAL_COMMUNITY): Payer: Self-pay

## 2021-12-30 ENCOUNTER — Other Ambulatory Visit (HOSPITAL_COMMUNITY): Payer: Self-pay

## 2022-01-12 ENCOUNTER — Other Ambulatory Visit (HOSPITAL_COMMUNITY): Payer: Self-pay

## 2022-01-30 ENCOUNTER — Other Ambulatory Visit (HOSPITAL_COMMUNITY): Payer: Self-pay

## 2022-03-01 ENCOUNTER — Other Ambulatory Visit (HOSPITAL_COMMUNITY): Payer: Self-pay

## 2022-03-30 ENCOUNTER — Other Ambulatory Visit (HOSPITAL_COMMUNITY): Payer: Self-pay

## 2022-03-30 MED ORDER — AMLODIPINE BESYLATE 5 MG PO TABS
5.0000 mg | ORAL_TABLET | Freq: Every day | ORAL | 0 refills | Status: DC
  Filled 2022-03-30: qty 30, 30d supply, fill #0

## 2022-04-13 ENCOUNTER — Other Ambulatory Visit (HOSPITAL_COMMUNITY): Payer: Self-pay

## 2022-04-14 DIAGNOSIS — Z23 Encounter for immunization: Secondary | ICD-10-CM | POA: Diagnosis not present

## 2022-04-14 DIAGNOSIS — E7800 Pure hypercholesterolemia, unspecified: Secondary | ICD-10-CM | POA: Diagnosis not present

## 2022-04-14 DIAGNOSIS — Z125 Encounter for screening for malignant neoplasm of prostate: Secondary | ICD-10-CM | POA: Diagnosis not present

## 2022-04-14 DIAGNOSIS — I10 Essential (primary) hypertension: Secondary | ICD-10-CM | POA: Diagnosis not present

## 2022-04-14 DIAGNOSIS — Z0001 Encounter for general adult medical examination with abnormal findings: Secondary | ICD-10-CM | POA: Diagnosis not present

## 2022-04-25 ENCOUNTER — Other Ambulatory Visit (HOSPITAL_COMMUNITY): Payer: Self-pay

## 2022-04-25 MED ORDER — AMLODIPINE BESYLATE 5 MG PO TABS
5.0000 mg | ORAL_TABLET | Freq: Every day | ORAL | 6 refills | Status: DC
  Filled 2022-04-25: qty 30, 30d supply, fill #0
  Filled 2022-05-31: qty 30, 30d supply, fill #1
  Filled 2022-07-01: qty 30, 30d supply, fill #2
  Filled 2022-07-31: qty 30, 30d supply, fill #3
  Filled 2022-08-30: qty 30, 30d supply, fill #4
  Filled 2022-10-04: qty 30, 30d supply, fill #5
  Filled 2022-11-04: qty 30, 30d supply, fill #6

## 2022-05-31 ENCOUNTER — Other Ambulatory Visit (HOSPITAL_COMMUNITY): Payer: Self-pay

## 2022-06-01 ENCOUNTER — Other Ambulatory Visit (HOSPITAL_COMMUNITY): Payer: Self-pay

## 2022-07-01 ENCOUNTER — Other Ambulatory Visit (HOSPITAL_COMMUNITY): Payer: Self-pay

## 2022-07-03 ENCOUNTER — Other Ambulatory Visit (HOSPITAL_COMMUNITY): Payer: Self-pay

## 2022-07-13 ENCOUNTER — Other Ambulatory Visit (HOSPITAL_COMMUNITY): Payer: Self-pay

## 2022-07-13 MED ORDER — PANTOPRAZOLE SODIUM 40 MG PO TBEC
40.0000 mg | DELAYED_RELEASE_TABLET | Freq: Every day | ORAL | 1 refills | Status: DC
  Filled 2022-07-13: qty 90, 90d supply, fill #0
  Filled 2022-10-13: qty 90, 90d supply, fill #1

## 2022-07-31 ENCOUNTER — Other Ambulatory Visit (HOSPITAL_COMMUNITY): Payer: Self-pay

## 2022-08-02 ENCOUNTER — Other Ambulatory Visit (HOSPITAL_COMMUNITY): Payer: Self-pay

## 2022-08-30 ENCOUNTER — Other Ambulatory Visit (HOSPITAL_COMMUNITY): Payer: Self-pay

## 2022-10-04 ENCOUNTER — Other Ambulatory Visit (HOSPITAL_COMMUNITY): Payer: Self-pay

## 2022-10-13 ENCOUNTER — Other Ambulatory Visit (HOSPITAL_COMMUNITY): Payer: Self-pay

## 2022-10-18 DIAGNOSIS — R1310 Dysphagia, unspecified: Secondary | ICD-10-CM | POA: Diagnosis not present

## 2022-10-18 DIAGNOSIS — Z23 Encounter for immunization: Secondary | ICD-10-CM | POA: Diagnosis not present

## 2022-10-18 DIAGNOSIS — I10 Essential (primary) hypertension: Secondary | ICD-10-CM | POA: Diagnosis not present

## 2022-12-11 ENCOUNTER — Other Ambulatory Visit (HOSPITAL_COMMUNITY): Payer: Self-pay

## 2022-12-11 MED ORDER — AMLODIPINE BESYLATE 5 MG PO TABS
5.0000 mg | ORAL_TABLET | Freq: Every day | ORAL | 6 refills | Status: DC
  Filled 2022-12-11: qty 30, 30d supply, fill #0
  Filled 2023-01-14: qty 30, 30d supply, fill #1
  Filled 2023-02-15: qty 30, 30d supply, fill #2
  Filled 2023-03-20: qty 30, 30d supply, fill #3
  Filled 2023-04-19 (×2): qty 30, 30d supply, fill #4
  Filled 2023-05-25: qty 30, 30d supply, fill #5
  Filled 2023-06-27: qty 30, 30d supply, fill #6

## 2023-01-26 ENCOUNTER — Other Ambulatory Visit (HOSPITAL_COMMUNITY): Payer: Self-pay

## 2023-01-26 MED ORDER — PANTOPRAZOLE SODIUM 40 MG PO TBEC
40.0000 mg | DELAYED_RELEASE_TABLET | Freq: Every day | ORAL | 1 refills | Status: DC
  Filled 2023-01-26: qty 90, 90d supply, fill #0
  Filled 2023-04-19 (×2): qty 90, 90d supply, fill #1

## 2023-01-27 ENCOUNTER — Other Ambulatory Visit (HOSPITAL_COMMUNITY): Payer: Self-pay

## 2023-02-15 ENCOUNTER — Other Ambulatory Visit (HOSPITAL_COMMUNITY): Payer: Self-pay

## 2023-03-20 ENCOUNTER — Other Ambulatory Visit: Payer: Self-pay

## 2023-04-19 ENCOUNTER — Other Ambulatory Visit (HOSPITAL_COMMUNITY): Payer: Self-pay

## 2023-05-29 ENCOUNTER — Other Ambulatory Visit (HOSPITAL_COMMUNITY): Payer: Self-pay

## 2023-06-12 DIAGNOSIS — E7800 Pure hypercholesterolemia, unspecified: Secondary | ICD-10-CM | POA: Diagnosis not present

## 2023-06-12 DIAGNOSIS — Z125 Encounter for screening for malignant neoplasm of prostate: Secondary | ICD-10-CM | POA: Diagnosis not present

## 2023-06-12 DIAGNOSIS — I10 Essential (primary) hypertension: Secondary | ICD-10-CM | POA: Diagnosis not present

## 2023-06-12 DIAGNOSIS — Z0001 Encounter for general adult medical examination with abnormal findings: Secondary | ICD-10-CM | POA: Diagnosis not present

## 2023-07-23 ENCOUNTER — Other Ambulatory Visit (HOSPITAL_COMMUNITY): Payer: Self-pay

## 2023-07-23 MED ORDER — AMLODIPINE BESYLATE 5 MG PO TABS
5.0000 mg | ORAL_TABLET | Freq: Every day | ORAL | 1 refills | Status: DC
  Filled 2023-07-23: qty 90, 90d supply, fill #0
  Filled 2023-11-02: qty 90, 90d supply, fill #1

## 2023-07-23 MED ORDER — PANTOPRAZOLE SODIUM 40 MG PO TBEC
40.0000 mg | DELAYED_RELEASE_TABLET | Freq: Every day | ORAL | 1 refills | Status: DC
  Filled 2023-07-23: qty 90, 90d supply, fill #0
  Filled 2023-11-02: qty 90, 90d supply, fill #1

## 2023-07-24 ENCOUNTER — Other Ambulatory Visit (HOSPITAL_COMMUNITY): Payer: Self-pay

## 2023-08-03 ENCOUNTER — Other Ambulatory Visit: Payer: Self-pay | Admitting: Surgery

## 2023-08-03 DIAGNOSIS — K429 Umbilical hernia without obstruction or gangrene: Secondary | ICD-10-CM | POA: Diagnosis not present

## 2023-08-06 ENCOUNTER — Other Ambulatory Visit (HOSPITAL_COMMUNITY): Payer: Self-pay

## 2023-08-06 MED ORDER — BISACODYL 5 MG PO TBEC
DELAYED_RELEASE_TABLET | ORAL | 0 refills | Status: AC
Start: 2023-07-31 — End: ?
  Filled 2023-08-06: qty 4, 1d supply, fill #0

## 2023-08-06 MED ORDER — PEG 3350-KCL-NA BICARB-NACL 420 G PO SOLR
ORAL | 0 refills | Status: AC
  Filled 2023-08-06: qty 4000, 1d supply, fill #0

## 2023-08-08 ENCOUNTER — Other Ambulatory Visit (HOSPITAL_COMMUNITY): Payer: Self-pay

## 2023-08-15 DIAGNOSIS — K648 Other hemorrhoids: Secondary | ICD-10-CM | POA: Diagnosis not present

## 2023-08-15 DIAGNOSIS — Z860101 Personal history of adenomatous and serrated colon polyps: Secondary | ICD-10-CM | POA: Diagnosis not present

## 2023-08-15 DIAGNOSIS — K644 Residual hemorrhoidal skin tags: Secondary | ICD-10-CM | POA: Diagnosis not present

## 2023-08-15 DIAGNOSIS — Z09 Encounter for follow-up examination after completed treatment for conditions other than malignant neoplasm: Secondary | ICD-10-CM | POA: Diagnosis not present

## 2023-08-15 DIAGNOSIS — D124 Benign neoplasm of descending colon: Secondary | ICD-10-CM | POA: Diagnosis not present

## 2023-10-05 DIAGNOSIS — H5203 Hypermetropia, bilateral: Secondary | ICD-10-CM | POA: Diagnosis not present

## 2023-10-05 DIAGNOSIS — H01001 Unspecified blepharitis right upper eyelid: Secondary | ICD-10-CM | POA: Diagnosis not present

## 2023-10-05 DIAGNOSIS — H524 Presbyopia: Secondary | ICD-10-CM | POA: Diagnosis not present

## 2023-10-05 DIAGNOSIS — H01004 Unspecified blepharitis left upper eyelid: Secondary | ICD-10-CM | POA: Diagnosis not present

## 2024-01-21 DIAGNOSIS — I10 Essential (primary) hypertension: Secondary | ICD-10-CM | POA: Diagnosis not present

## 2024-01-21 DIAGNOSIS — E7800 Pure hypercholesterolemia, unspecified: Secondary | ICD-10-CM | POA: Diagnosis not present

## 2024-01-21 DIAGNOSIS — R17 Unspecified jaundice: Secondary | ICD-10-CM | POA: Diagnosis not present

## 2024-01-23 DIAGNOSIS — I10 Essential (primary) hypertension: Secondary | ICD-10-CM | POA: Diagnosis not present

## 2024-01-23 DIAGNOSIS — K222 Esophageal obstruction: Secondary | ICD-10-CM | POA: Diagnosis not present

## 2024-01-23 DIAGNOSIS — E7800 Pure hypercholesterolemia, unspecified: Secondary | ICD-10-CM | POA: Diagnosis not present

## 2024-02-03 ENCOUNTER — Other Ambulatory Visit (HOSPITAL_COMMUNITY): Payer: Self-pay

## 2024-02-04 ENCOUNTER — Other Ambulatory Visit (HOSPITAL_COMMUNITY): Payer: Self-pay

## 2024-02-04 MED ORDER — AMLODIPINE BESYLATE 5 MG PO TABS
5.0000 mg | ORAL_TABLET | Freq: Every day | ORAL | 1 refills | Status: DC
  Filled 2024-02-04: qty 90, 90d supply, fill #0
  Filled 2024-05-05: qty 90, 90d supply, fill #1
  Filled 2024-08-04: qty 90, 90d supply, fill #2

## 2024-02-04 MED ORDER — PANTOPRAZOLE SODIUM 40 MG PO TBEC
40.0000 mg | DELAYED_RELEASE_TABLET | Freq: Every day | ORAL | 1 refills | Status: DC
  Filled 2024-02-04: qty 90, 90d supply, fill #0
  Filled 2024-05-05: qty 90, 90d supply, fill #1
  Filled 2024-08-04: qty 90, 90d supply, fill #2

## 2024-06-16 ENCOUNTER — Other Ambulatory Visit (HOSPITAL_COMMUNITY): Payer: Self-pay

## 2024-06-16 DIAGNOSIS — L570 Actinic keratosis: Secondary | ICD-10-CM | POA: Diagnosis not present

## 2024-06-16 DIAGNOSIS — L814 Other melanin hyperpigmentation: Secondary | ICD-10-CM | POA: Diagnosis not present

## 2024-06-16 DIAGNOSIS — L821 Other seborrheic keratosis: Secondary | ICD-10-CM | POA: Diagnosis not present

## 2024-06-16 DIAGNOSIS — D225 Melanocytic nevi of trunk: Secondary | ICD-10-CM | POA: Diagnosis not present

## 2024-06-16 MED ORDER — FLUOROURACIL 5 % EX CREA
TOPICAL_CREAM | Freq: Two times a day (BID) | CUTANEOUS | 0 refills | Status: AC
  Filled 2024-06-16: qty 40, 14d supply, fill #0

## 2024-07-07 DIAGNOSIS — E7800 Pure hypercholesterolemia, unspecified: Secondary | ICD-10-CM | POA: Diagnosis not present

## 2024-07-07 DIAGNOSIS — I10 Essential (primary) hypertension: Secondary | ICD-10-CM | POA: Diagnosis not present

## 2024-07-07 DIAGNOSIS — Z1159 Encounter for screening for other viral diseases: Secondary | ICD-10-CM | POA: Diagnosis not present

## 2024-07-07 DIAGNOSIS — Z125 Encounter for screening for malignant neoplasm of prostate: Secondary | ICD-10-CM | POA: Diagnosis not present

## 2024-07-09 DIAGNOSIS — R1310 Dysphagia, unspecified: Secondary | ICD-10-CM | POA: Diagnosis not present

## 2024-07-09 DIAGNOSIS — I10 Essential (primary) hypertension: Secondary | ICD-10-CM | POA: Diagnosis not present

## 2024-07-09 DIAGNOSIS — Z0189 Encounter for other specified special examinations: Secondary | ICD-10-CM | POA: Diagnosis not present

## 2024-07-09 DIAGNOSIS — R945 Abnormal results of liver function studies: Secondary | ICD-10-CM | POA: Diagnosis not present

## 2024-07-09 DIAGNOSIS — E7800 Pure hypercholesterolemia, unspecified: Secondary | ICD-10-CM | POA: Diagnosis not present

## 2024-08-04 ENCOUNTER — Other Ambulatory Visit: Payer: Self-pay

## 2024-08-04 ENCOUNTER — Other Ambulatory Visit (HOSPITAL_COMMUNITY): Payer: Self-pay

## 2024-08-04 MED ORDER — AMLODIPINE BESYLATE 5 MG PO TABS
5.0000 mg | ORAL_TABLET | Freq: Every day | ORAL | 1 refills | Status: AC
  Filled 2024-08-04: qty 90, 90d supply, fill #0
  Filled 2024-10-29: qty 90, 90d supply, fill #1

## 2024-08-04 MED ORDER — PANTOPRAZOLE SODIUM 40 MG PO TBEC
40.0000 mg | DELAYED_RELEASE_TABLET | Freq: Every day | ORAL | 1 refills | Status: AC
  Filled 2024-08-04: qty 90, 90d supply, fill #0
  Filled 2024-10-29: qty 90, 90d supply, fill #1

## 2024-08-06 DIAGNOSIS — I10 Essential (primary) hypertension: Secondary | ICD-10-CM | POA: Diagnosis not present

## 2024-10-29 ENCOUNTER — Other Ambulatory Visit (HOSPITAL_COMMUNITY): Payer: Self-pay
